# Patient Record
Sex: Female | Born: 1935 | Race: Black or African American | Hispanic: No
Health system: Southern US, Community
[De-identification: ages and names within clinical notes are randomized; demographics above are authoritative.]

---

## 2004-01-22 ENCOUNTER — Other Ambulatory Visit: Payer: Self-pay

## 2004-01-22 ENCOUNTER — Emergency Department: Payer: Self-pay | Admitting: Emergency Medicine

## 2004-01-26 ENCOUNTER — Emergency Department: Payer: Self-pay | Admitting: Emergency Medicine

## 2004-05-04 ENCOUNTER — Inpatient Hospital Stay: Payer: Self-pay | Admitting: Internal Medicine

## 2004-06-16 ENCOUNTER — Ambulatory Visit: Payer: Self-pay

## 2004-07-22 ENCOUNTER — Emergency Department: Payer: Self-pay | Admitting: Emergency Medicine

## 2004-08-11 ENCOUNTER — Emergency Department: Payer: Self-pay | Admitting: Emergency Medicine

## 2004-08-14 ENCOUNTER — Emergency Department: Payer: Self-pay | Admitting: Emergency Medicine

## 2004-10-26 ENCOUNTER — Ambulatory Visit: Payer: Self-pay

## 2005-03-26 ENCOUNTER — Emergency Department: Payer: Self-pay | Admitting: Emergency Medicine

## 2005-03-26 ENCOUNTER — Other Ambulatory Visit: Payer: Self-pay

## 2005-03-29 ENCOUNTER — Other Ambulatory Visit: Payer: Self-pay

## 2005-03-29 ENCOUNTER — Emergency Department: Payer: Self-pay | Admitting: Emergency Medicine

## 2005-05-06 ENCOUNTER — Emergency Department: Payer: Self-pay | Admitting: General Practice

## 2005-05-30 ENCOUNTER — Emergency Department: Payer: Self-pay | Admitting: Emergency Medicine

## 2005-05-30 ENCOUNTER — Other Ambulatory Visit: Payer: Self-pay

## 2005-08-11 ENCOUNTER — Other Ambulatory Visit: Payer: Self-pay

## 2005-08-11 ENCOUNTER — Emergency Department: Payer: Self-pay | Admitting: Emergency Medicine

## 2005-11-17 ENCOUNTER — Inpatient Hospital Stay: Payer: Self-pay | Admitting: Internal Medicine

## 2005-11-17 ENCOUNTER — Other Ambulatory Visit: Payer: Self-pay

## 2006-01-04 ENCOUNTER — Other Ambulatory Visit: Payer: Self-pay

## 2006-01-04 ENCOUNTER — Emergency Department: Payer: Self-pay | Admitting: Emergency Medicine

## 2006-01-30 ENCOUNTER — Emergency Department: Payer: Self-pay | Admitting: Internal Medicine

## 2006-01-30 ENCOUNTER — Other Ambulatory Visit: Payer: Self-pay

## 2006-02-05 ENCOUNTER — Ambulatory Visit: Payer: Self-pay | Admitting: Family Medicine

## 2006-08-31 ENCOUNTER — Other Ambulatory Visit: Payer: Self-pay

## 2006-08-31 ENCOUNTER — Inpatient Hospital Stay: Payer: Self-pay | Admitting: Internal Medicine

## 2006-09-01 ENCOUNTER — Other Ambulatory Visit: Payer: Self-pay

## 2006-11-07 ENCOUNTER — Other Ambulatory Visit: Payer: Self-pay

## 2006-11-07 ENCOUNTER — Emergency Department: Payer: Self-pay | Admitting: Emergency Medicine

## 2007-04-26 ENCOUNTER — Emergency Department: Payer: Self-pay | Admitting: Unknown Physician Specialty

## 2007-07-06 ENCOUNTER — Emergency Department: Payer: Self-pay | Admitting: Emergency Medicine

## 2007-07-06 ENCOUNTER — Other Ambulatory Visit: Payer: Self-pay

## 2007-12-04 ENCOUNTER — Emergency Department: Payer: Self-pay | Admitting: Emergency Medicine

## 2007-12-04 ENCOUNTER — Other Ambulatory Visit: Payer: Self-pay

## 2008-07-21 ENCOUNTER — Emergency Department: Payer: Self-pay | Admitting: Emergency Medicine

## 2009-08-16 ENCOUNTER — Emergency Department: Payer: Self-pay | Admitting: Emergency Medicine

## 2009-08-24 ENCOUNTER — Emergency Department: Payer: Self-pay | Admitting: Emergency Medicine

## 2009-10-28 ENCOUNTER — Emergency Department: Payer: Self-pay

## 2010-02-21 ENCOUNTER — Ambulatory Visit: Payer: Self-pay | Admitting: Internal Medicine

## 2010-07-22 ENCOUNTER — Emergency Department: Payer: Self-pay | Admitting: Internal Medicine

## 2011-02-06 IMAGING — CT CT HEAD WITHOUT CONTRAST
1 series · 16 of 30 positions shown, 20 images · non-contrast
Comparison: none

REASON FOR EXAM: STAT CR [id number]  memory loss  eval dementia
COMMENTS:

PROCEDURE:     KCT - KCT HEAD WITHOUT CONTRAST  - February 21, 2010  [DATE]
RESULT:     Head CT dated 02/21/2010.
TECHNIQUE: Helical 5 mm sections were obtained from the skull base to the
vertex without administration of intravenous contrast. The study was
compared to previous study dated 04/26/2007.

[Series 2: soft tissue · axial · 0.40mm/px · z∈[-103,+37]mm · 16 of 32 slices shown, 20 images]
[im 2/32  brain]
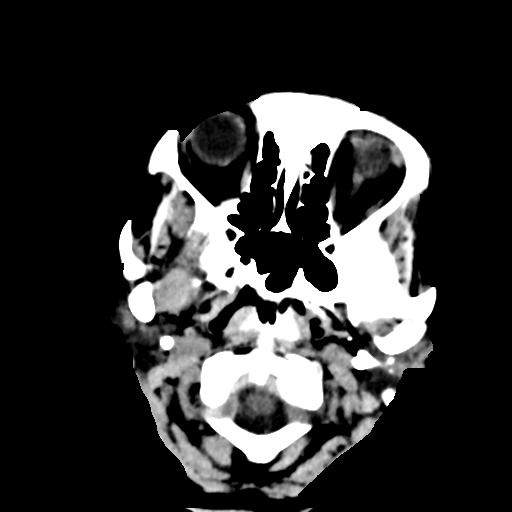
[im 2/32  bone]
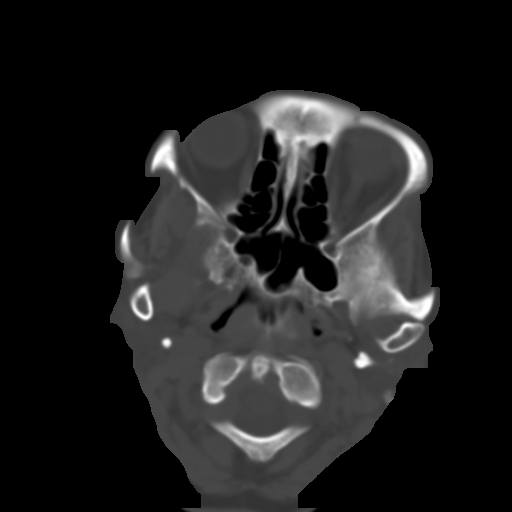
[im 4/32  brain]
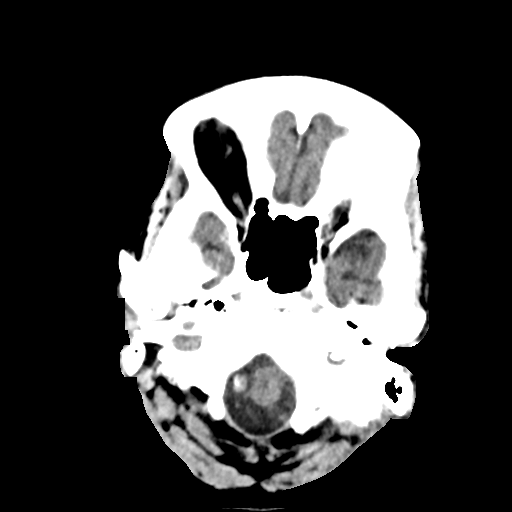
[im 6/32  brain]
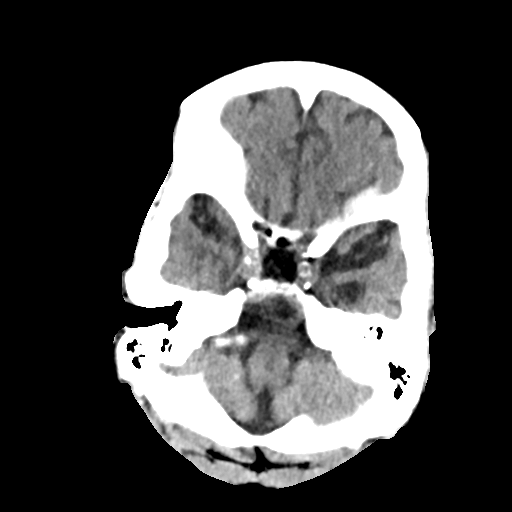
[im 8/32  brain]
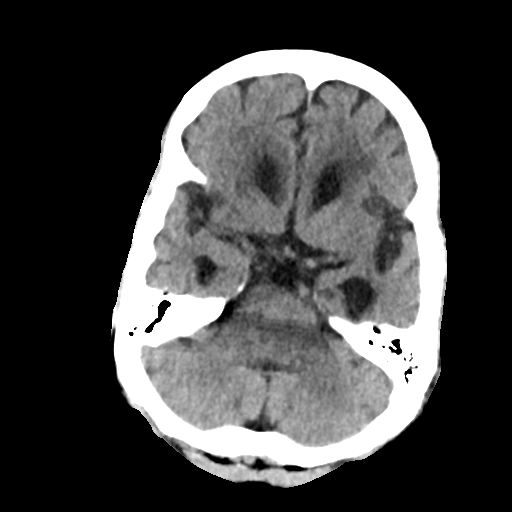
[im 9/32  brain]
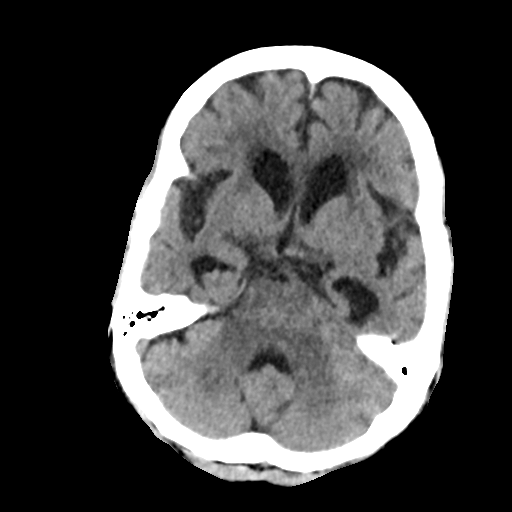
[im 9/32  bone]
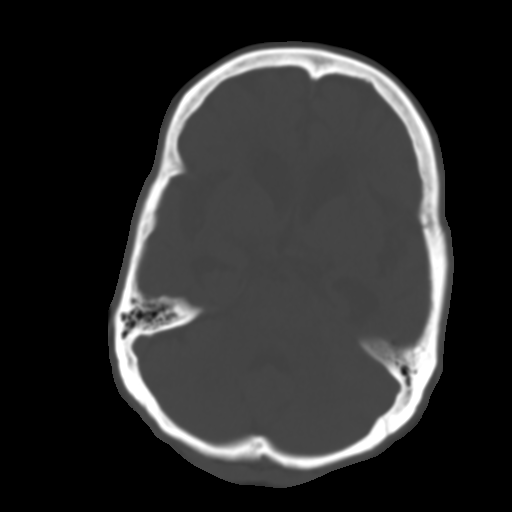
[im 11/32  brain]
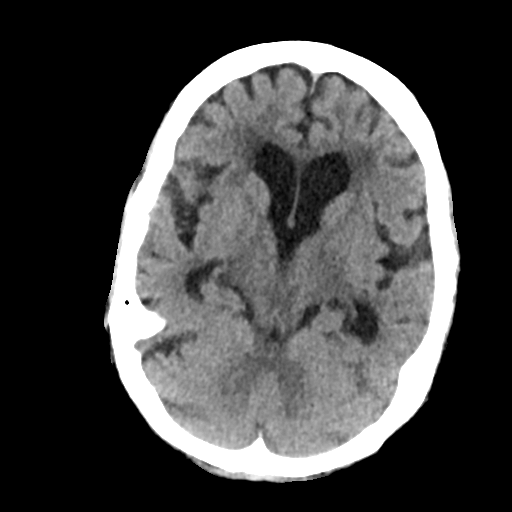
[im 13/32  brain]
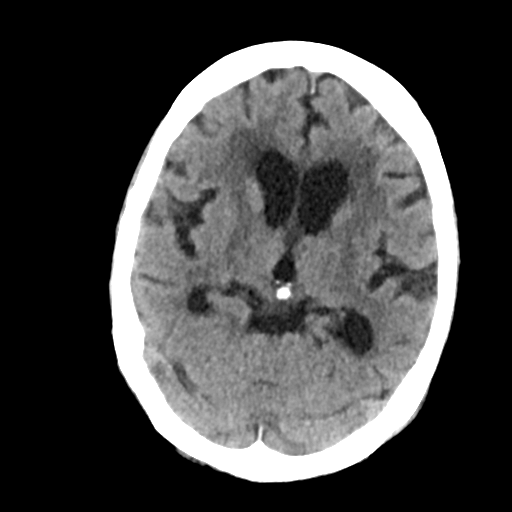
[im 15/32  brain]
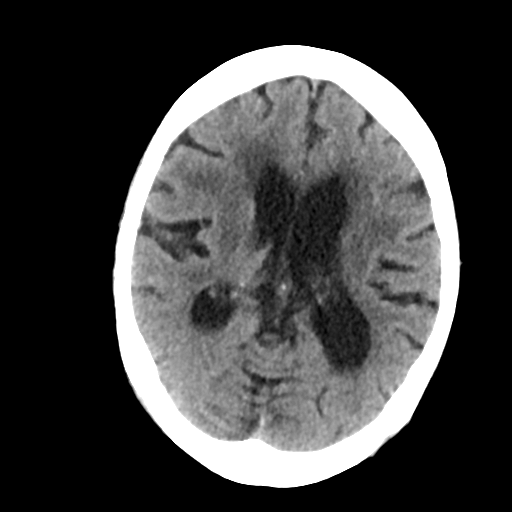
[im 17/32  brain]
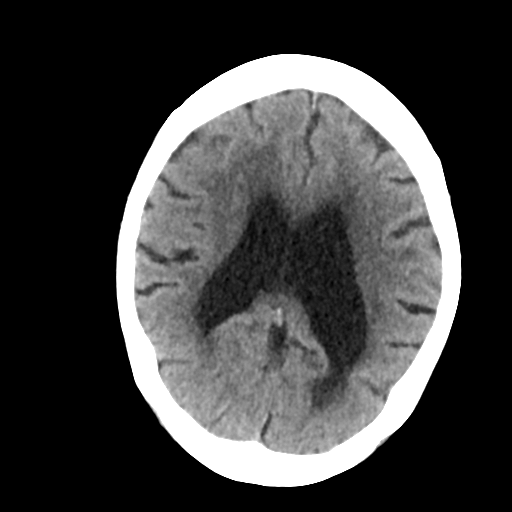
[im 17/32  bone]
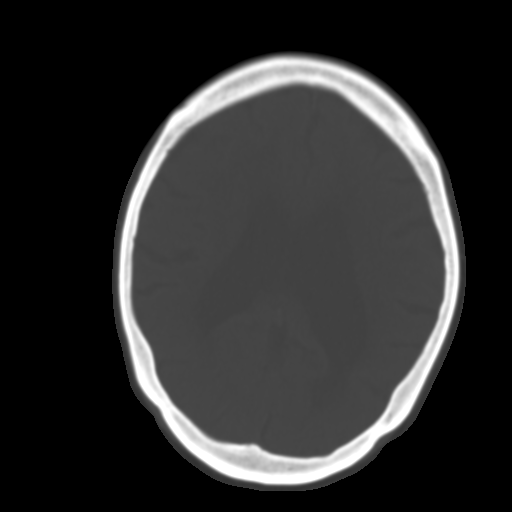
[im 19/32  brain]
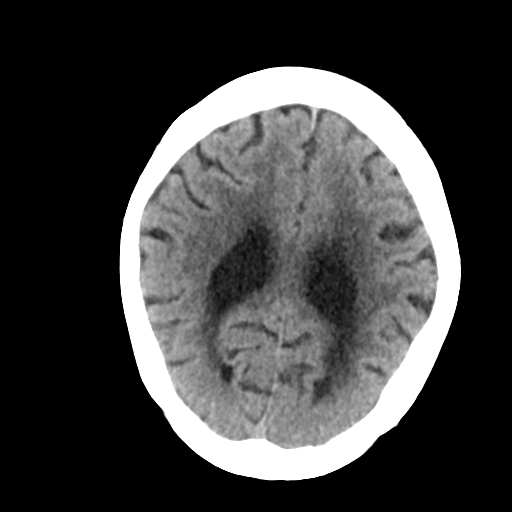
[im 21/32  brain]
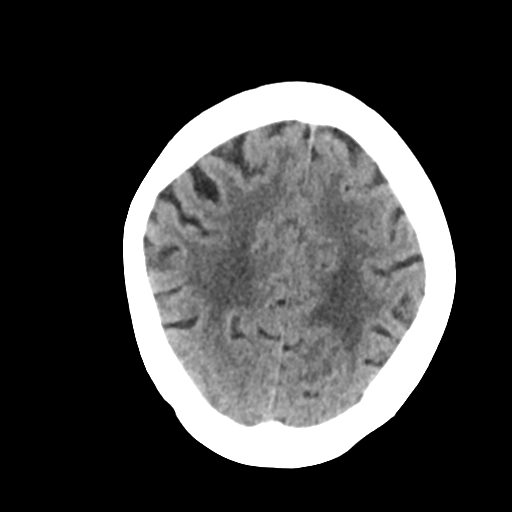
[im 23/32  brain]
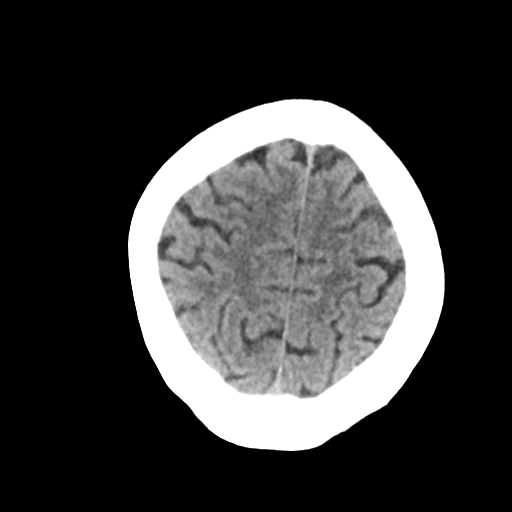
[im 24/32  brain]
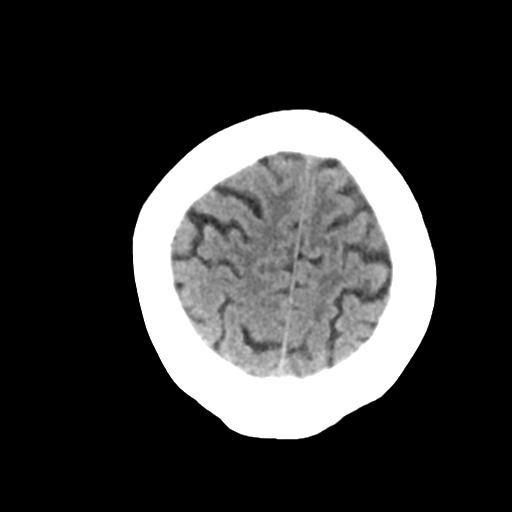
[im 24/32  bone]
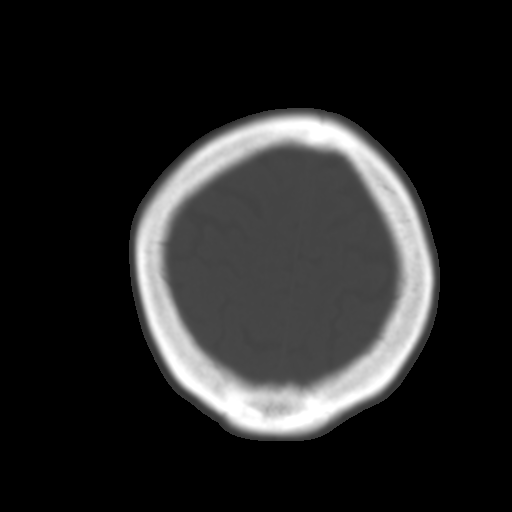
[im 26/32  brain]
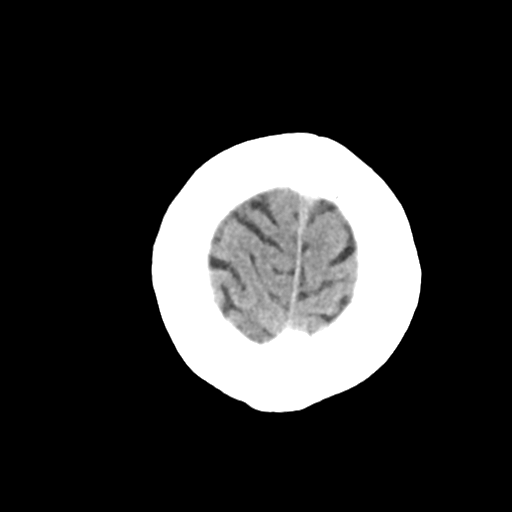
[im 28/32  brain]
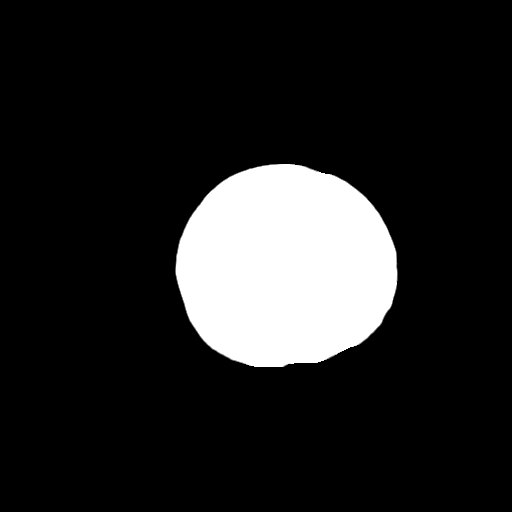
[im 30/32  brain]
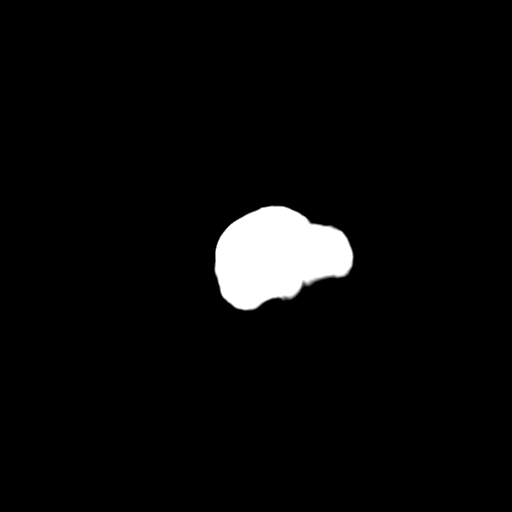

[16 of 30 positions shown; findings below may reference images not displayed]

FINDINGS: There is no evidence of intra-axial nor extra-axial fluid
collections nor evidence of acute hemorrhage. There is diffuse cortical
atrophy which is moderate to severe. Areas of low attenuation project within
the subcortical, deep, and periventricular white matter regions.
Hydrocephalus ex vacuo is identified. There is no evidence of mass effect.
The cerebellum and pons are grossly unremarkable. There is no evidence of a
depressed skull fracture.
IMPRESSION: Moderate to severe involutional changes without evidence of
acute abnormalities. There is persistent clinical concern further evaluation
with MRI is recommended.

## 2013-07-08 ENCOUNTER — Inpatient Hospital Stay: Payer: Self-pay | Admitting: Internal Medicine

## 2013-07-08 LAB — PROTIME-INR
INR: 1
Prothrombin Time: 13.3 secs (ref 11.5–14.7)

## 2013-07-08 LAB — MAGNESIUM: Magnesium: 2.5 mg/dL — ABNORMAL HIGH

## 2013-07-08 LAB — COMPREHENSIVE METABOLIC PANEL
ALK PHOS: 72 U/L
Albumin: 2.5 g/dL — ABNORMAL LOW (ref 3.4–5.0)
Anion Gap: 4 — ABNORMAL LOW (ref 7–16)
BUN: 13 mg/dL (ref 7–18)
Bilirubin,Total: 0.4 mg/dL (ref 0.2–1.0)
CHLORIDE: 114 mmol/L — AB (ref 98–107)
Calcium, Total: 9.4 mg/dL (ref 8.5–10.1)
Co2: 37 mmol/L — ABNORMAL HIGH (ref 21–32)
Creatinine: 0.86 mg/dL (ref 0.60–1.30)
Glucose: 97 mg/dL (ref 65–99)
Osmolality: 307 (ref 275–301)
Potassium: 2.9 mmol/L — ABNORMAL LOW (ref 3.5–5.1)
SGOT(AST): 25 U/L (ref 15–37)
SGPT (ALT): 18 U/L (ref 12–78)
Sodium: 155 mmol/L — ABNORMAL HIGH (ref 136–145)
Total Protein: 7.1 g/dL (ref 6.4–8.2)

## 2013-07-08 LAB — CBC WITH DIFFERENTIAL/PLATELET
Basophil #: 0 10*3/uL (ref 0.0–0.1)
Basophil %: 0.4 %
EOS ABS: 0 10*3/uL (ref 0.0–0.7)
Eosinophil %: 0.5 %
HCT: 34.9 % — AB (ref 35.0–47.0)
HGB: 11.2 g/dL — ABNORMAL LOW (ref 12.0–16.0)
LYMPHS PCT: 17.8 %
Lymphocyte #: 1.5 10*3/uL (ref 1.0–3.6)
MCH: 31.1 pg (ref 26.0–34.0)
MCHC: 32.1 g/dL (ref 32.0–36.0)
MCV: 97 fL (ref 80–100)
Monocyte #: 0.6 x10 3/mm (ref 0.2–0.9)
Monocyte %: 7.6 %
Neutrophil #: 6.2 10*3/uL (ref 1.4–6.5)
Neutrophil %: 73.7 %
Platelet: 224 10*3/uL (ref 150–440)
RBC: 3.6 10*6/uL — ABNORMAL LOW (ref 3.80–5.20)
RDW: 16 % — ABNORMAL HIGH (ref 11.5–14.5)
WBC: 8.4 10*3/uL (ref 3.6–11.0)

## 2013-07-08 LAB — URINALYSIS, COMPLETE
BILIRUBIN, UR: NEGATIVE
GLUCOSE, UR: NEGATIVE mg/dL (ref 0–75)
Ketone: NEGATIVE
Nitrite: NEGATIVE
Ph: 7 (ref 4.5–8.0)
Protein: 30
RBC,UR: 1 /HPF (ref 0–5)
SPECIFIC GRAVITY: 1.018 (ref 1.003–1.030)

## 2013-07-08 LAB — BASIC METABOLIC PANEL
Anion Gap: 1 — ABNORMAL LOW (ref 7–16)
BUN: 11 mg/dL (ref 7–18)
CALCIUM: 8.8 mg/dL (ref 8.5–10.1)
CHLORIDE: 114 mmol/L — AB (ref 98–107)
Co2: 36 mmol/L — ABNORMAL HIGH (ref 21–32)
Creatinine: 0.7 mg/dL (ref 0.60–1.30)
EGFR (African American): >60
Glucose: 106 mg/dL — ABNORMAL HIGH (ref 65–99)
Osmolality: 300 (ref 275–301)
POTASSIUM: 3.1 mmol/L — AB (ref 3.5–5.1)
Sodium: 151 mmol/L — ABNORMAL HIGH (ref 136–145)

## 2013-07-08 LAB — APTT: ACTIVATED PTT: 28.5 s (ref 23.6–35.9)

## 2013-07-09 ENCOUNTER — Ambulatory Visit: Payer: Self-pay | Admitting: Internal Medicine

## 2013-07-09 LAB — COMPREHENSIVE METABOLIC PANEL
Albumin: 2.3 g/dL — ABNORMAL LOW (ref 3.4–5.0)
Alkaline Phosphatase: 61 U/L
Anion Gap: 4 — ABNORMAL LOW (ref 7–16)
BUN: 10 mg/dL (ref 7–18)
Bilirubin,Total: 0.4 mg/dL (ref 0.2–1.0)
CALCIUM: 8.6 mg/dL (ref 8.5–10.1)
CHLORIDE: 114 mmol/L — AB (ref 98–107)
Co2: 32 mmol/L (ref 21–32)
Creatinine: 0.67 mg/dL (ref 0.60–1.30)
EGFR (Non-African Amer.): >60
Glucose: 87 mg/dL (ref 65–99)
Osmolality: 296 (ref 275–301)
Potassium: 3.8 mmol/L (ref 3.5–5.1)
SGOT(AST): 21 U/L (ref 15–37)
SGPT (ALT): 18 U/L (ref 12–78)
Sodium: 150 mmol/L — ABNORMAL HIGH (ref 136–145)
TOTAL PROTEIN: 6.3 g/dL — AB (ref 6.4–8.2)

## 2013-07-22 ENCOUNTER — Ambulatory Visit: Payer: Self-pay | Admitting: Internal Medicine

## 2013-07-22 DEATH — deceased

## 2014-08-14 NOTE — Consult Note (Signed)
Note dictated for consult.  Pt with severe Alzheimers disease and unable to take in calories so family is requesting a feeding tube.  If serum sodium below 150 will try to place tube tomorrow late afternoon with Dr. Katrinka BlazingSmith.  Electronic Signatures: Scot JunElliott, [place] Landgrebe T (MD)  (Signed on 18-Mar-15 17:15)  Authored  Last Updated: 18-Mar-15 17:15 by Scot JunElliott, Terrez Ander T (MD)

## 2014-08-14 NOTE — Consult Note (Signed)
PATIENT NAME:  Nicole Bullock, [name] MR#:  [id number] DATE OF BIRTH:  10-Jan-1929  DATE OF CONSULTATION:  07/08/2013  CONSULTING PHYSICIAN:  Scot Junobert T. Elliott, MD  The patient is an 81 year old black female with a history of progressive dementia, who [facility], who is bedbound and because of her dementia has refused to eat. She was [facility] to [facility] for [facility] for possible feeding tube.   The patient's family, even though she is a No Code/Do Not Resuscitate, would like very much for her to have a feeding tube. They do not want to face her dying of starvation.   PAST MEDICAL HISTORY: 1.  Hypertension.  2.  Severe Alzheimer's dementia.  3.  Hyperlipidemia.   ALLERGIES: No known drug allergies.   CURRENT MEDICATIONS: Enalapril 5 mg once a day.   FAMILY HISTORY: Positive for CVA.   REVIEW OF SYSTEMS: The patient is unable to give a review of systems because of her dementia.   PHYSICAL EXAMINATION: GENERAL: Blood pressure 146/67, respirations 18, temperature 96.8, pulse 54, 97% sat on room air. GENERAL: Very thin, cachectic-looking black female.  HEENT: Sclerae anicteric. Conjunctivae negative. Head is atraumatic.  NECK: Trachea in the midline.  CHEST: Clear in anterolateral fields.  HEART: Normal S1 and S2, I/VI systolic murmur.  ABDOMEN: Very thin, flat. Bowel sounds are present. No palpable organomegaly. There appears to be a small midline incision below the umbilicus, possibly hysterectomy.  EXTREMITIES: No edema.  SKIN: Warm and dry.   LABORATORY AND RADIOLOGICAL DATA: Sodium is 155, potassium 2.9, chloride 114, bicarbonate 37, BUN 13, creatinine 0.86, glucose 97, albumin 2.5. White count 8.4, hemoglobin 11.2, platelet count 224. PT, INR of 1. Chest x-ray shows no acute infiltrates.   ASSESSMENT: The patient needs a percutaneous endoscopic gastrostomy tube at request of family because of starvation secondary to  Alzheimer's disease.   PLAN: Will contact Dr. Katrinka BlazingSmith and try to do a PEG tomorrow, if her serum sodium is down below 150. This will be likely from an endoscopic pull-through PEG placement and will try to put stitches in to keep it from being removed by the patient.   ____________________________ Scot Junobert T. Elliott, MD rte:jcm D: 07/08/2013 17:06:42 ET T: 07/08/2013 18:08:48 ET JOB#: [id number]  cc: Scot Junobert T. Elliott, MD, <Dictator> Srikar R. Sudini, MD J. Renda RollsWilton Smith, MD Scot JunOBERT T ELLIOTT MD ELECTRONICALLY SIGNED 07/17/2013 18:20

## 2014-08-14 NOTE — Consult Note (Signed)
PATIENT NAME:  Nicole Bullock, Nicole Bullock MR#:  [id number] DATE OF BIRTH:  11-Apr-1929  DATE OF CONSULTATION:  07/08/2013  CONSULTING PHYSICIAN:  Adella HareJ. Wilton Smith, MD  This 81 year old female was admitted emergently to the hospital. Dr. Mechele CollinElliott requested surgical consultation regarding a problem with dementia and cessation of oral intake with evidence of weakness and dehydration and family request of a feeding tube.   Some history was taken from her sister, who was present at the bedside, along with a brother-in-law and also a medical record. She does have history of dementia and limited activities. Has been confined to bed and, according to the sister, she has stopped taking anything to eat or drink by mouth. Has been gradually getting worse over the last few months and has been admitted emergently.   PAST MEDICAL HISTORY: Does include:  1.  Hypertension.  2.  Dementia.  3.  Hyperlipidemia.   SOCIAL HISTORY: She has been living in a skilled nursing facility, has been bedbound.   MEDICATIONS: Include enalapril 5 mg daily.   REVIEW OF SYSTEMS: Were not obtainable as the patient does not answer questions during the course of the examination.  Sister is unclear as to how long she has been going without oral intake.   PHYSICAL EXAMINATION: GENERAL: She is in the hospital bed. Her hands are moving. Does seem to have some semipurposeful movements of her hands, grasping at sheets and gown. Does move her lips, but there is no understandable speech. Does not appear to be in any distress.  VITAL SIGNS: Temperature 98, pulse 60, respirations 16, blood pressure 145/81. Pulse ox is 99%.  SKIN: Warm and dry.  HEENT: Palpebral conjunctivae appear to have red color. The patient was uncooperative and did not open mouth for exam.  RESPIRATORY:  Lung sounds are clear.  HEART: Regular rhythm, S1, S2.  ABDOMEN:  With some degree of firmness but flat and nontender with no palpable mass. No apparent scar.  EXTREMITIES:  With  heel protectors on her feet.  NEUROLOGIC:  She appears to be awake, but does not speak. I do not see any identifiable facial expression. She does move both hands. Can squeeze my fingers bilaterally. Did not witness any foot movement.   OTHER LABORATORY DATA: Her glucose is 97, creatinine 0.86, BUN 13, potassium 2.9, albumin 2.5, hemoglobin 11.2, platelet count 224,000, pro time 13.3 seconds. Urinalysis with 1+ bacteria.   IMPRESSION: Profound dementia with hypoalbuminemia, hypokalemia, that appears to have cognitive dysfunction and resultant dysphagia with loss of oral intake.   According to the sister, the family is interested in her having a feeding tube. I did advise the sister that I think it would be best to let her pass away quietly without the feeding tube. I have placed a call to her son, March RummageJames Giese, requesting callback.   This was discussed with Dr. Mechele CollinElliott, and if the family does want to have a feeding tube, we could potentially do that tomorrow with local anesthesia and monitored anesthesia care. Could do the endoscopic percutaneous technique. I did advise the sister that there is some risk; the patient may pull the tube out. Without the tube, likely will pass away over a period of days or, at most, weeks, or with a feeding tube could survey a number of weeks, months or years.   ____________________________ Shela CommonsJ. Renda RollsWilton Smith, MD jws:dmm D: 07/08/2013 17:28:16 ET T: 07/08/2013 19:09:52 ET JOB#: [id number]  cc: Adella HareJ. Wilton Smith, MD, <Dictator> Adella HareWILTON J SMITH MD ELECTRONICALLY SIGNED 07/17/2013  18:40 

## 2014-08-14 NOTE — Discharge Summary (Signed)
PATIENT NAME:  Nicole Bullock, Nicole Bullock MR#:  [id number] DATE OF BIRTH:  01/16/1929  DATE OF ADMISSION:  07/08/2013 DATE OF DISCHARGE:  07/09/2013  [facility].   DISCHARGE DIAGNOSES: 1.  Severe dehydration.  2.  Hypernatremia.  3.  Hypokalemia.  4.  Failure to thrive.  5.  Advanced dementia.  6.  Hypertension.  7.  Severe malnutrition.  8.  Anemia of chronic disease.  9.  Hypoalbuminemia.   CONSULTANTS:  1.  Dr. Mechele CollinElliott with GI.  2.  Dr. Harvie JuniorPhifer with palliative care.  3.  Dr. Katrinka BlazingSmith with surgery.   ADMITTING HISTORY AND PHYSICAL AND HOSPITAL COURSE: Please see detailed H and P dictated previously. In brief, an 81 year old African American female patient with advanced dementia, which has been worsening with decreased p.o. intake, was sent in as a direct admission from Dr. Earnest ConroyElliott's office for a PEG tube placement. The patient was started on half normal saline secondary to her dehydration and hypernatremia, with which she improved a little, but at baseline, the patient has severe dementia, has poor p.o. intake. I did consult Dr. Mechele CollinElliott for PEG tube initially after family requested that she have a PEG tube placed. Dr. Mechele CollinElliott consulted  Dr. Katrinka BlazingSmith of surgery. On further discussion, palliative care was requested to see the patient, and after Dr. Harvie JuniorPhifer discussed with both the patient's son, family decided that they do not want a PEG tube for the patient, as she has progressive dementia that has been worsening over the past few months and this would not change her quality of life. The patient is high risk for further deterioration, cardiac arrest and death. Family is aware. She is a DNR/DNI. Seems to be comfortable, in no pain and not anxious.   Prior to discharge, the patient wakes up on calling her name, is confused, pulling on things. Lungs sound clear. Cardiac examination shows S1 and S2. Abdomen is nontender.   DISCHARGE MEDICATIONS:  1.  Enalapril 5 mg daily.  2.   Lorazepam 2 mg/mL 0.5 mg oral every 6 hours as needed for anxiety.  3.  Ensure 1 bottle 237 mL 3 times a day.   DISCHARGE INSTRUCTIONS: Regular diet, regular consistency. Activity as tolerated with assistance. The patient is being set up with hospice, which will [facility]. The [facility], depending on her further conditions.   CODE STATUS: DNR/DNI ON HOSPICE.   Discharge time spent today was 40 minutes.    ____________________________ Molinda BailiffSrikar R. Arella Blinder, MD srs:dmm D: 07/09/2013 12:53:30 ET T: 07/09/2013 13:07:30 ET JOB#: [id number]  cc: Wardell HeathSrikar R. Ranny Wiebelhaus, MD, <Dictator> Orie FishermanSRIKAR R Dereona Kolodny MD ELECTRONICALLY SIGNED 07/18/2013 10:27

## 2014-08-14 NOTE — H&P (Signed)
PATIENT NAME:  Nicole Bullock, CALDAS MR#:  [id number] DATE OF BIRTH:  Jul 28, 1928  DATE OF ADMISSION:  07/08/2013  CHIEF COMPLAINT:  Decreased p.o. intake and dehydration.   HISTORY OF PRESENT ILLNESS:  An 81 year old African American female patient with history of hypertension, Alzheimer's dementia, who has had progressively worsening dementia over the past few months, who is bedbound and sometimes in a wheelchair and always confused, presents to the Emergency Room sent in by Dr. Mechele Collin due to dehydration, dysphagia, decreased p.o. intake and need for a PEG tube placement. Both her sons are at bedside. History has been obtained from her sons, old history and nursing notes. The patient is drowsy, confused and unable to contribute to history.   Son mentions that the patient has had progressively worsening dementia, decreased intake. On further discussing with Dr. Mechele Collin, they have decided that the patient will need a PEG tube. She is DO NOT RESUSCITATE, DO NOT INTUBATE, but they do want a PEG tube and the son mentioned that they do not want their mom to starve to death.  They also understand that this will be artificial nutrition.   PAST MEDICAL HISTORY: 1.  Hypertension.  2.  Advanced Alzheimer's dementia.  3.  Hyperlipidemia.   ALLERGIES: No known drug allergies.   HOME MEDICATIONS: Enalapril 5 mg oral once a day.   SOCIAL HISTORY: The patient lives at skilled nursing facility, is bedbound.   FAMILY HISTORY: Father had stroke.   REVIEW OF SYSTEMS: Unobtainable secondary to the patient's confusion.   PHYSICAL EXAMINATION:  VITAL SIGNS:  Temperature 96.8, pulse of 54, respirations 18, blood pressure 146/67. Saturating 97% on room air.  GENERAL:  Elderly, cachectic African American female patient lying in bed, restless, drowsy.  PSYCHIATRIC: Drowsy, lying in bed. Mood and affect cannot be appreciated.  HEENT: Atraumatic, normocephalic. Oral mucosa dry and pink. No ulcers or thrush. External ears  and nose normal. Pallor positive. No icterus. Pupils are bilaterally equal and reactive to light NECK: Supple, no thyromegaly , trachea midline. No carotid bruit or JVD.  CARDIOVASCULAR: S1 and S2, systolic murmur. Peripheral pulses 2+, bradycardia. No edema.  RESPIRATORY: Normal work of breathing. Clear to auscultation on both sides.  GASTROINTESTINAL: Soft abdomen, nontender. Bowel sounds normal, No hepatosplenomegaly palpable.  GENITOURINARY: No significant bladder distention.  SKIN: Warm and dry. No petechiae, rash, ulcers.  MUSCULOSKELETAL: No joint swelling, redness, effusion of the large joints. Diffuse muscle wasting.  NEUROLOGICAL: Moves all 4 extremities, seem to be symmetrical. Reflexes 2+.  LYMPHATIC:  No cervical lymphadenopathy.   LABORATORY, DIAGNOSTIC AND RADIOLOGICAL DATA:   1.  Show glucose 97, BUN 13, creatinine 0.86 with sodium 155, potassium 2.9, chloride 114, bicarb 37. GFR greater than 60, magnesium 2.5. AST, ALT, alkaline phosphatase, bilirubin normal. Albumin 2.5.  2.  WBC 8.4, hemoglobin 11.2, platelets of 224.  3.  INR 1. PTT of 28.5.  4.  EKG shows sinus bradycardia.  5.  Chest x-ray, portable, shows no acute abnormalities.   ASSESSMENT AND PLAN: 1.  Severe dehydration with hypernatremia and hypokalemia. The patient will be started on aggressive IV fluid resuscitation. We will start her with half-normal saline bolus of 1000 mL now along with half-normal saline with potassium running continuously. We will also replace potassium IV with 40 mEq now. This is secondary to decreased p.o. intake.  2.  Dementia, poor p.o. intake. This has been a progressively worsening problem. I have discussed with her healthcare power of attorney, son at bedside, regarding the  percutaneous endoscopic gastrostomy tube.  He does not want her to be resuscitated but does want the percutaneous endoscopic gastrostomy tube placed as he does not want her to starve to death.  I have advised that this  would not change her quality of life. He does understand it.  I have offered palliative care consultation to help him decide regarding the percutaneous endoscopic gastrostomy tube but mentioned that they, after discussing with Dr. Mechele CollinElliott, have decided to get the percutaneous endoscopic gastrostomy tube.  Dr. Mechele CollinElliott is aware that the patient is in the hospital.  3.  Severe malnutrition. The patient will be started on percutaneous endoscopic gastrostomy tube  feeds once this was placed and can be discharged back to skilled nursing facility.  4.  Hypertension. Will hold the enalapril, use IV blood pressure meds at this time till the patient is more awake.  5.  Acute encephalopathy secondary to severe dehydration and hypernatremia.  6.  Deep vein thrombosis prophylaxis with Lovenox. Will give the dose after percutaneous endoscopic gastrostomy tube  placed.  7.  CODE STATUS: DNR/DNI.   TIME SPENT TODAY ON THIS CASE: 50 minutes.    ____________________________ Molinda BailiffSrikar R. Jazier Mcglamery, MD srs:cs D: 07/08/2013 14:52:03 ET T: 07/08/2013 15:20:32 ET JOB#: [id number]  cc: Wardell HeathSrikar R. Savannaha Stonerock, MD, <Dictator> Orie FishermanSRIKAR R Kazmir Oki MD ELECTRONICALLY SIGNED 07/18/2013 10:27
